# Patient Record
Sex: Female | Born: 1980 | Race: White | Hispanic: No | Marital: Married | Smoking: Former smoker
Health system: Southern US, Community
[De-identification: ages and names within clinical notes are randomized; demographics above are authoritative.]

## PROBLEM LIST (undated history)

## (undated) DIAGNOSIS — O24419 Gestational diabetes mellitus in pregnancy, unspecified control: Secondary | ICD-10-CM

## (undated) DIAGNOSIS — E538 Deficiency of other specified B group vitamins: Secondary | ICD-10-CM

## (undated) DIAGNOSIS — F411 Generalized anxiety disorder: Secondary | ICD-10-CM

## (undated) DIAGNOSIS — F410 Panic disorder [episodic paroxysmal anxiety] without agoraphobia: Secondary | ICD-10-CM

## (undated) DIAGNOSIS — F988 Other specified behavioral and emotional disorders with onset usually occurring in childhood and adolescence: Secondary | ICD-10-CM

## (undated) DIAGNOSIS — E119 Type 2 diabetes mellitus without complications: Secondary | ICD-10-CM

## (undated) DIAGNOSIS — F329 Major depressive disorder, single episode, unspecified: Secondary | ICD-10-CM

## (undated) HISTORY — DX: Type 2 diabetes mellitus without complications: E11.9

## (undated) HISTORY — PX: MOUTH SURGERY: SHX715

## (undated) HISTORY — DX: Deficiency of other specified B group vitamins: E53.8

---

## 2012-04-01 ENCOUNTER — Inpatient Hospital Stay (HOSPITAL_COMMUNITY)
Admission: AD | Admit: 2012-04-01 | Discharge: 2012-04-01 | Disposition: A | Discharge status: Home / Self Care | Payer: BC Managed Care – PPO | Source: Ambulatory Visit | Attending: Family Medicine | Admitting: Family Medicine

## 2012-04-01 ENCOUNTER — Inpatient Hospital Stay (HOSPITAL_COMMUNITY): Payer: BC Managed Care – PPO

## 2012-04-01 ENCOUNTER — Encounter (HOSPITAL_COMMUNITY): Payer: Self-pay

## 2012-04-01 DIAGNOSIS — O219 Vomiting of pregnancy, unspecified: Secondary | ICD-10-CM

## 2012-04-01 DIAGNOSIS — O26859 Spotting complicating pregnancy, unspecified trimester: Secondary | ICD-10-CM | POA: Insufficient documentation

## 2012-04-01 DIAGNOSIS — O210 Mild hyperemesis gravidarum: Secondary | ICD-10-CM | POA: Insufficient documentation

## 2012-04-01 DIAGNOSIS — O209 Hemorrhage in early pregnancy, unspecified: Secondary | ICD-10-CM

## 2012-04-01 LAB — CBC
HCT: 35.4 % — ABNORMAL LOW (ref 36.0–46.0)
MCH: 31 pg (ref 26.0–34.0)
MCV: 92.9 fL (ref 78.0–100.0)
Platelets: 327 10*3/uL (ref 150–400)
RDW: 12.6 % (ref 11.5–15.5)

## 2012-04-01 LAB — TYPE AND SCREEN: Antibody Screen: NEGATIVE

## 2012-04-01 LAB — WET PREP, GENITAL
Trich, Wet Prep: NONE SEEN
Yeast Wet Prep HPF POC: NONE SEEN

## 2012-04-01 LAB — URINALYSIS, ROUTINE W REFLEX MICROSCOPIC
Glucose, UA: NEGATIVE mg/dL
Hgb urine dipstick: NEGATIVE
Specific Gravity, Urine: 1.01 (ref 1.005–1.030)
pH: 7 (ref 5.0–8.0)

## 2012-04-01 LAB — POCT PREGNANCY, URINE: Preg Test, Ur: POSITIVE — AB

## 2012-04-01 LAB — URINE MICROSCOPIC-ADD ON

## 2012-04-01 NOTE — MAU Provider Note (Signed)
  History     CSN: [id number]  Arrival date and time: 04/01/12 1705   None     Chief Complaint  Patient presents with  . Vaginal Bleeding   HPI  31 year old female here with her husband for concern for spotting.  Has had 4 episodes of spotting, the first three were discharge mixed with blood, and then a fourth episode which was drops of blood.  No abdominal pain.    OB History    Grav Para Term Preterm Abortions TAB SAB Ect Mult Living   2    1 1           History reviewed. No pertinent past medical history.  Past Surgical History  Procedure Date  . Mouth surgery     History reviewed. No pertinent family history.  History  Substance Use Topics  . Smoking status: Current Everyday Smoker    Types: Cigarettes  . Smokeless tobacco: Not on file   Comment: quit ist of june 2013  . Alcohol Use: No    Allergies:  Allergies  Allergen Reactions  . Augmentin (Amoxicillin-Pot Clavulanate) Nausea And Vomiting    Prescriptions prior to admission  Medication Sig Dispense Refill  . PRENATAL VITAMINS PO Take 1 tablet by mouth daily.        ROS  See HPI.  Other pertinent ROS negative.  Physical Exam   Blood pressure 109/63, pulse 71, temperature 98.6 F (37 C), temperature source Oral, resp. rate 18, height 5' 5.5" (1.664 m), weight 58.514 kg (129 lb), last menstrual period 02/18/2012.  Physical Exam  Constitutional: She appears well-developed and well-nourished.  HENT:  Head: Normocephalic and atraumatic.  Eyes: Conjunctivae and EOM are normal.  Neck: No tracheal deviation present.  Cardiovascular: Normal rate, regular rhythm and normal heart sounds.   Respiratory: Effort normal and breath sounds normal. No stridor.  GI: Soft. She exhibits no distension and no mass. There is no tenderness. There is no rebound and no guarding.  Genitourinary:       No lesions on labia externally.  Minimal leukorrhea, no blood.  Cervix normal.  Sample for wet prep and GC/chlam  taken.  Cervix palpated as firm, closed, thick, posterior.    MAU Course  Procedures  Ultrasound: Technique: Transabdominal ultrasound was performed for evaluation  of the gestation as well as the maternal uterus and adnexal  regions.  Comparison: None.  Intrauterine gestational sac: Present.  Yolk sac: Present  Embryo: Present.  Cardiac Activity: Present; 117 beats per minute  CRL: 6 mm 6w 4d [place] EDC: 11/21/2012  Maternal uterus/Adnexae:  Normal appearance of the ovaries. No significant free fluid. No  subchorionic hemorrhage.  IMPRESSION:  Intrauterine pregnancy of 6 weeks 6 days with fetal heart rate of  117 beats per minute.   Assessment and Plan  Implantation bleeding Nausea of pregnancy  Continue routine prenatal care  Clancy Gourd 04/01/2012, 6:12 PM   Patient seen and examined.  Agree with above note.  Levie Heritage, DO 04/01/2012 8:06 PM

## 2012-04-01 NOTE — Discharge Instructions (Signed)
Come back to the MAU if you experience continued bleeding or cramping. --- Morning Sickness Morning sickness is when you feel sick to your stomach (nauseous) during pregnancy. This nauseous feeling may or may not come with throwing up (vomiting). It often occurs in the morning, but can be a problem any time of day. While morning sickness is unpleasant, it is usually harmless unless you develop severe and continual vomiting (hyperemesis gravidarum). This condition requires more intense treatment. CAUSES  The cause of morning sickness is not completely known but seems to be related to a sudden increase of two hormones:   Human chorionic gonadotropin (hCG).   Estrogen hormone.  These are elevated in the first part of the pregnancy. TREATMENT  Do not use any medicines (prescription, over-the-counter, or herbal) for morning sickness without first talking to your caregiver. Some patients are helped by the following:  Vitamin B6 (25mg  every 8 hours) or vitamin B6 shots.   An antihistamine called doxylamine (10mg  every 8 hours).   The herbal medication ginger.  HOME CARE INSTRUCTIONS   Taking multivitamins before getting pregnant can prevent or decrease the severity of morning sickness in most women.   Eat a piece of dry toast or unsalted crackers before getting out of bed in the morning.   Eat 5 or 6 small meals a day.   Eat dry and bland foods (rice, baked potato).   Do not drink liquids with your meals. Drink liquids between meals.   Avoid greasy, fatty, and spicy foods.   Get someone to cook for you if the smell of any food causes nausea and vomiting.   Avoid vitamin pills with iron because iron can cause nausea.   Snack on protein foods between meals if you are hungry.   Eat unsweetened gelatins for deserts.   Wear an acupressure wristband (worn for sea sickness) may be helpful.   Acupuncture may be helpful.   Do not smoke.   Get a humidifier to keep the air in your house  free of odors.  SEEK MEDICAL CARE IF:   Your home remedies are not working and you need medication.   You feel dizzy or lightheaded.   You are losing weight.   You need help with your diet.  SEEK IMMEDIATE MEDICAL CARE IF:   You have persistent and uncontrolled nausea and vomiting.   You pass out (faint).   You have a fever.  MAKE SURE YOU:   Understand these instructions.   Will watch your condition.   Will get help right away if you are not doing well or get worse.  Document Released: 11/29/2006 Document Revised: 09/27/2011 Document Reviewed: 09/26/2007 [facility] Patient Information 2012 [place], [place]

## 2012-04-01 NOTE — MAU Note (Signed)
Patient is here with c/o intermittent spotting. Today its red spot twice and no pain. She states that she had a mild cramp once on Friday. She has her 1st ob appt [facility] on 04/14/12.

## 2012-04-01 NOTE — MAU Note (Signed)
Pregnant.  No pain. Last Wed had one pink spot, has noticed spotting almost every day since then .  Today was more red then pink.

## 2012-04-02 LAB — GC/CHLAMYDIA PROBE AMP, GENITAL: GC Probe Amp, Genital: NEGATIVE

## 2012-04-17 LAB — OB RESULTS CONSOLE RUBELLA ANTIBODY, IGM: Rubella: IMMUNE

## 2012-04-17 LAB — OB RESULTS CONSOLE HEPATITIS B SURFACE ANTIGEN: Hepatitis B Surface Ag: NEGATIVE

## 2012-04-17 LAB — OB RESULTS CONSOLE RPR: RPR: NONREACTIVE

## 2012-09-17 ENCOUNTER — Encounter: Payer: Self-pay | Admitting: *Deleted

## 2012-09-17 ENCOUNTER — Encounter: Payer: BC Managed Care – PPO | Attending: Obstetrics and Gynecology | Admitting: *Deleted

## 2012-09-17 DIAGNOSIS — Z713 Dietary counseling and surveillance: Secondary | ICD-10-CM | POA: Insufficient documentation

## 2012-09-17 DIAGNOSIS — O99810 Abnormal glucose complicating pregnancy: Secondary | ICD-10-CM | POA: Insufficient documentation

## 2012-09-17 NOTE — Patient Instructions (Signed)
Goals:  Check glucose levels per MD as instructed  Follow Gestational Diabetes Diet as instructed  Call for follow-up as needed    

## 2012-09-17 NOTE — Progress Notes (Signed)
  Patient was seen on 09/17/2012 for Gestational Diabetes self-management class at the [facility]. The following learning objectives were met by the patient during this course:   States the definition of Gestational Diabetes  States why dietary management is important in controlling blood glucose  Describes the effects each nutrient has on blood glucose levels  Demonstrates ability to create a balanced meal plan  Demonstrates carbohydrate counting   States when to check blood glucose levels  Demonstrates proper blood glucose monitoring techniques  States the effect of stress and exercise on blood glucose levels  States the importance of limiting caffeine and abstaining from alcohol and smoking  Blood glucose monitor given: Blood glucose monitor given: Contour Next EZ Blood Glucose Kit Lot # [id number] Exp: 08/2013 Blood glucose reading: 89 mg/dl  Patient instructed to monitor glucose levels: FBS: 60 - <90 2 hour: <120  *Patient received handouts:  Nutrition Diabetes and Pregnancy  Carbohydrate Counting List  Patient will be seen for follow-up as needed.

## 2012-10-23 LAB — OB RESULTS CONSOLE GBS: GBS: NEGATIVE

## 2012-11-14 ENCOUNTER — Encounter (HOSPITAL_COMMUNITY): Payer: Self-pay | Admitting: *Deleted

## 2012-11-14 ENCOUNTER — Inpatient Hospital Stay (HOSPITAL_COMMUNITY)
Admission: AD | Admit: 2012-11-14 | Discharge: 2012-11-14 | Disposition: A | Discharge status: Home / Self Care | Payer: BC Managed Care – PPO | Source: Ambulatory Visit | Attending: Obstetrics and Gynecology | Admitting: Obstetrics and Gynecology

## 2012-11-14 DIAGNOSIS — R109 Unspecified abdominal pain: Secondary | ICD-10-CM | POA: Insufficient documentation

## 2012-11-14 DIAGNOSIS — O479 False labor, unspecified: Secondary | ICD-10-CM | POA: Insufficient documentation

## 2012-11-14 DIAGNOSIS — O99810 Abnormal glucose complicating pregnancy: Secondary | ICD-10-CM | POA: Insufficient documentation

## 2012-11-14 HISTORY — DX: Gestational diabetes mellitus in pregnancy, unspecified control: O24.419

## 2012-11-14 NOTE — MAU Provider Note (Signed)
Chief Complaint:  Abdominal Pain   First Provider Initiated Contact with Patient 11/14/12 1514      HPI: Victoria Weeks is a 32 y.o. G2P0010 at [redacted]w[redacted]d who presents to maternity admissions reporting bilateral pain in sides/groin. She was at work and started feeling pain, like muscle cramp. Tried to walk it off. Had to stop walking a couple of times due to pain. No fever/chills. No nausea, vomiting, diarrhea or constipation. No dysuria or urgency. No vaginal bleeding or loss of fluid. Good fetal movement.   Sees Dr. Vickey Sages, last visit 11/06/12. Next visit on 11/21/12. Complications this pregnancy include diet controlled GDM.   Pregnancy Course:   Past Medical History: Past Medical History  Diagnosis Date  . Diabetes mellitus without complication   . Gestational diabetes     Past obstetric history: OB History    Grav Para Term Preterm Abortions TAB SAB Ect Mult Living   2    1 1          # Outc Date GA Lbr Len/2nd Wgt Sex Del Anes PTL Lv   1 TAB            2 CUR               Past Surgical History: Past Surgical History  Procedure Date  . Mouth surgery     Family History: History reviewed. No pertinent family history.  Social History: History  Substance Use Topics  . Smoking status: Former Smoker    Types: Cigarettes  . Smokeless tobacco: Not on file     Comment: quit ist of june 2013  . Alcohol Use: No    Allergies:  Allergies  Allergen Reactions  . Augmentin (Amoxicillin-Pot Clavulanate) Nausea And Vomiting    Meds:  Prescriptions prior to admission  Medication Sig Dispense Refill  . calcium carbonate (TUMS - DOSED IN MG ELEMENTAL CALCIUM) 500 MG chewable tablet Chew 1-2 tablets by mouth daily as needed. For heartburn      . Prenatal Vit-Fe Fumarate-FA (PRENATAL MULTIVITAMIN) TABS Take 1 tablet by mouth daily with lunch.        ROS: Pertinent findings in history of present illness.  Physical Exam  Blood pressure 104/75, pulse 89, temperature 97.5 F (36.4  C), temperature source Oral, resp. rate 20, last menstrual period 02/18/2012. GENERAL: Well-developed, well-nourished female in no acute distress.  HEENT: normocephalic HEART: normal rate RESP: normal effort ABDOMEN: Soft, non-tender, gravid appropriate for gestational age EXTREMITIES: Nontender, no edema NEURO: alert and oriented SPECULUM EXAM: NEFG, physiologic discharge, no blood, cervix clean Dilation: Closed Effacement (%): 30 Cervical Position: Posterior Station: -3 Exam by:: Dorrene German RN  FHT:  Baseline 120 , moderate variability, accelerations present, no decelerations Contractions: irreg, mild, uterine irritability   Labs: No results found for this or any previous visit (from the past 24 hour(s)).  Imaging:  No results found. MAU Course:   Assessment: Braxton-Hicks contractions or round ligament pain  Plan: Discharge home Labor precautions and fetal kick counts Discussed with Dr. Vincente Poli.    Medication List     As of 11/14/2012  3:14 PM    ASK your doctor about these medications         calcium carbonate 500 MG chewable tablet   Commonly known as: TUMS - dosed in mg elemental calcium   Chew 1-2 tablets by mouth daily as needed. For heartburn      prenatal multivitamin Tabs   Take 1 tablet by mouth daily with lunch.  Napoleon Form, MD 11/14/12  16:08 PM

## 2012-11-14 NOTE — MAU Note (Signed)
Sharp lower abd pain, worse on R since 1240, unsure if uc's, denies bleeding or LOF.  Active FM.

## 2012-11-14 NOTE — Progress Notes (Signed)
Dr. Vincente Poli informed of pt status, C/O sharp Lower abd pain, unsure if uc's, irregular uc's, reactive FHR, SVE... Requests that MAU mid-level see pt.

## 2012-11-21 ENCOUNTER — Inpatient Hospital Stay (HOSPITAL_COMMUNITY)
Admission: AD | Admit: 2012-11-21 | Discharge: 2012-11-24 | DRG: 372 | Disposition: A | Discharge status: Home / Self Care | Payer: BC Managed Care – PPO | Source: Ambulatory Visit | Attending: Obstetrics & Gynecology | Admitting: Obstetrics & Gynecology

## 2012-11-21 ENCOUNTER — Encounter (HOSPITAL_COMMUNITY): Payer: Self-pay | Admitting: *Deleted

## 2012-11-21 ENCOUNTER — Encounter (HOSPITAL_COMMUNITY): Payer: Self-pay | Admitting: Anesthesiology

## 2012-11-21 ENCOUNTER — Inpatient Hospital Stay (HOSPITAL_COMMUNITY): Payer: BC Managed Care – PPO | Admitting: Anesthesiology

## 2012-11-21 DIAGNOSIS — O4290 Premature rupture of membranes, unspecified as to length of time between rupture and onset of labor, unspecified weeks of gestation: Principal | ICD-10-CM | POA: Diagnosis present

## 2012-11-21 DIAGNOSIS — O99814 Abnormal glucose complicating childbirth: Secondary | ICD-10-CM | POA: Diagnosis present

## 2012-11-21 HISTORY — DX: Generalized anxiety disorder: F41.1

## 2012-11-21 HISTORY — DX: Panic disorder (episodic paroxysmal anxiety): F41.0

## 2012-11-21 HISTORY — DX: Major depressive disorder, single episode, unspecified: F32.9

## 2012-11-21 HISTORY — DX: Other specified behavioral and emotional disorders with onset usually occurring in childhood and adolescence: F98.8

## 2012-11-21 LAB — GLUCOSE, CAPILLARY
Glucose-Capillary: 102 mg/dL — ABNORMAL HIGH (ref 70–99)
Glucose-Capillary: 109 mg/dL — ABNORMAL HIGH (ref 70–99)

## 2012-11-21 LAB — POCT FERN TEST: POCT Fern Test: POSITIVE

## 2012-11-21 LAB — CBC
Hemoglobin: 11.8 g/dL — ABNORMAL LOW (ref 12.0–15.0)
RBC: 3.93 MIL/uL (ref 3.87–5.11)

## 2012-11-21 LAB — RPR: RPR Ser Ql: NONREACTIVE

## 2012-11-21 MED ORDER — OXYTOCIN 40 UNITS IN LACTATED RINGERS INFUSION - SIMPLE MED
1.0000 m[IU]/min | INTRAVENOUS | Status: DC
  Administered 2012-11-21: 2 m[IU]/min via INTRAVENOUS
  Filled 2012-11-21: qty 1000

## 2012-11-21 MED ORDER — BUTORPHANOL TARTRATE 1 MG/ML IJ SOLN
1.0000 mg | INTRAMUSCULAR | Status: DC | PRN
  Administered 2012-11-21 (×2): 1 mg via INTRAVENOUS
  Filled 2012-11-21 (×2): qty 1

## 2012-11-21 MED ORDER — PHENYLEPHRINE 40 MCG/ML (10ML) SYRINGE FOR IV PUSH (FOR BLOOD PRESSURE SUPPORT)
80.0000 ug | PREFILLED_SYRINGE | INTRAVENOUS | Status: DC | PRN
  Filled 2012-11-21: qty 5

## 2012-11-21 MED ORDER — TERBUTALINE SULFATE 1 MG/ML IJ SOLN: 0.2500 mg | Freq: Once | INTRAMUSCULAR | Status: AC | PRN

## 2012-11-21 MED ORDER — IBUPROFEN 600 MG PO TABS
600.0000 mg | ORAL_TABLET | Freq: Four times a day (QID) | ORAL | Status: DC | PRN
  Administered 2012-11-22: 600 mg via ORAL
  Filled 2012-11-21: qty 1

## 2012-11-21 MED ORDER — PHENYLEPHRINE 40 MCG/ML (10ML) SYRINGE FOR IV PUSH (FOR BLOOD PRESSURE SUPPORT): 80.0000 ug | PREFILLED_SYRINGE | INTRAVENOUS | Status: DC | PRN

## 2012-11-21 MED ORDER — LACTATED RINGERS IV SOLN
INTRAVENOUS | Status: DC
  Administered 2012-11-21: 15:00:00 via INTRAVENOUS
  Administered 2012-11-21: 125 mL/h via INTRAVENOUS

## 2012-11-21 MED ORDER — PROMETHAZINE HCL 25 MG/ML IJ SOLN: 12.5000 mg | Freq: Four times a day (QID) | INTRAMUSCULAR | Status: DC | PRN

## 2012-11-21 MED ORDER — ONDANSETRON HCL 4 MG/2ML IJ SOLN: 4.0000 mg | Freq: Four times a day (QID) | INTRAMUSCULAR | Status: DC | PRN

## 2012-11-21 MED ORDER — ACETAMINOPHEN 325 MG PO TABS: 650.0000 mg | ORAL_TABLET | ORAL | Status: DC | PRN

## 2012-11-21 MED ORDER — OXYCODONE-ACETAMINOPHEN 5-325 MG PO TABS
1.0000 | ORAL_TABLET | ORAL | Status: DC | PRN
  Administered 2012-11-22: 1 via ORAL
  Filled 2012-11-21: qty 1

## 2012-11-21 MED ORDER — LIDOCAINE HCL (PF) 1 % IJ SOLN
INTRAMUSCULAR | Status: DC | PRN
  Administered 2012-11-21 (×2): 9 mL

## 2012-11-21 MED ORDER — OXYTOCIN 40 UNITS IN LACTATED RINGERS INFUSION - SIMPLE MED: 62.5000 mL/h | INTRAVENOUS | Status: DC

## 2012-11-21 MED ORDER — CITRIC ACID-SODIUM CITRATE 334-500 MG/5ML PO SOLN
ORAL | Status: AC
  Filled 2012-11-21: qty 15

## 2012-11-21 MED ORDER — EPHEDRINE 5 MG/ML INJ: 10.0000 mg | INTRAVENOUS | Status: DC | PRN

## 2012-11-21 MED ORDER — FENTANYL 2.5 MCG/ML BUPIVACAINE 1/10 % EPIDURAL INFUSION (WH - ANES)
14.0000 mL/h | INTRAMUSCULAR | Status: DC
  Administered 2012-11-21: 14 mL/h via EPIDURAL
  Filled 2012-11-21 (×2): qty 125

## 2012-11-21 MED ORDER — LACTATED RINGERS IV SOLN
500.0000 mL | Freq: Once | INTRAVENOUS | Status: AC
  Administered 2012-11-21: 500 mL via INTRAVENOUS

## 2012-11-21 MED ORDER — LIDOCAINE HCL (PF) 1 % IJ SOLN
30.0000 mL | INTRAMUSCULAR | Status: DC | PRN
  Filled 2012-11-21: qty 30

## 2012-11-21 MED ORDER — CITRIC ACID-SODIUM CITRATE 334-500 MG/5ML PO SOLN
30.0000 mL | ORAL | Status: DC | PRN
  Administered 2012-11-21 (×2): 30 mL via ORAL
  Filled 2012-11-21: qty 15

## 2012-11-21 MED ORDER — DIPHENHYDRAMINE HCL 50 MG/ML IJ SOLN: 12.5000 mg | INTRAMUSCULAR | Status: DC | PRN

## 2012-11-21 MED ORDER — FENTANYL 2.5 MCG/ML BUPIVACAINE 1/10 % EPIDURAL INFUSION (WH - ANES)
INTRAMUSCULAR | Status: DC | PRN
  Administered 2012-11-21: 14 mL/h via EPIDURAL

## 2012-11-21 MED ORDER — FLEET ENEMA 7-19 GM/118ML RE ENEM: 1.0000 | ENEMA | RECTAL | Status: DC | PRN

## 2012-11-21 MED ORDER — LACTATED RINGERS IV SOLN
500.0000 mL | INTRAVENOUS | Status: DC | PRN
  Administered 2012-11-21: 500 mL via INTRAVENOUS

## 2012-11-21 MED ORDER — EPHEDRINE 5 MG/ML INJ
10.0000 mg | INTRAVENOUS | Status: DC | PRN
  Filled 2012-11-21: qty 4

## 2012-11-21 MED ORDER — OXYTOCIN BOLUS FROM INFUSION
500.0000 mL | INTRAVENOUS | Status: DC
  Administered 2012-11-22: 500 mL via INTRAVENOUS

## 2012-11-21 NOTE — Anesthesia Procedure Notes (Signed)
Epidural Patient location during procedure: OB Start time: 11/21/2012 4:15 PM End time: 11/21/2012 4:19 PM  Staffing Anesthesiologist: Sandrea Hughs Performed by: anesthesiologist   Preanesthetic Checklist Completed: patient identified, site marked, surgical consent, pre-op evaluation, timeout performed, IV checked, risks and benefits discussed and monitors and equipment checked  Epidural Patient position: sitting Prep: site prepped and draped and DuraPrep Patient monitoring: continuous pulse ox and blood pressure Approach: midline Injection technique: LOR air  Needle:  Needle type: Tuohy  Needle gauge: 17 G Needle length: 9 cm and 9 Needle insertion depth: 6 cm Catheter type: closed end flexible Catheter size: 19 Gauge Catheter at skin depth: 12 cm Test dose: negative and Other  Assessment Sensory level: T8 Events: blood not aspirated, injection not painful, no injection resistance, negative IV test and no paresthesia  Additional Notes Reason for block:procedure for pain

## 2012-11-21 NOTE — Progress Notes (Signed)
MD notified of pt status, FHR, decelerations, SVE, UC pattern, and RN interventions. MD states to labor down if decelerations resolve. Will continue to monitor.

## 2012-11-21 NOTE — Progress Notes (Signed)
MD updated on pt status, SVE and UC pattern. Will continue to monitor.

## 2012-11-21 NOTE — Anesthesia Preprocedure Evaluation (Signed)
Anesthesia Evaluation  Patient identified by MRN, date of birth, ID band Patient awake    Reviewed: Allergy & Precautions, H&P , NPO status , Patient's Chart, lab work & pertinent test results  Airway Mallampati: I TM Distance: >3 FB Neck ROM: full    Dental No notable dental hx.    Pulmonary neg pulmonary ROS,    Pulmonary exam normal       Cardiovascular negative cardio ROS      Neuro/Psych negative neurological ROS  negative psych ROS   GI/Hepatic negative GI ROS, Neg liver ROS,   Endo/Other  negative endocrine ROSdiabetes, Gestational  Renal/GU negative Renal ROS  negative genitourinary   Musculoskeletal negative musculoskeletal ROS (+)   Abdominal Normal abdominal exam  (+)   Peds  Hematology negative hematology ROS (+)   Anesthesia Other Findings   Reproductive/Obstetrics (+) Pregnancy                           Anesthesia Physical Anesthesia Plan  ASA: II  Anesthesia Plan: Epidural   Post-op Pain Management:    Induction:   Airway Management Planned:   Additional Equipment:   Intra-op Plan:   Post-operative Plan:   Informed Consent: I have reviewed the patients History and Physical, chart, labs and discussed the procedure including the risks, benefits and alternatives for the proposed anesthesia with the patient or authorized representative who has indicated his/her understanding and acceptance.     Plan Discussed with:   Anesthesia Plan Comments:         Anesthesia Quick Evaluation

## 2012-11-21 NOTE — H&P (Signed)
Victoria Weeks is a 32 y.o. female presenting for PROM at 630.  CTX q 3-4 minutes with increasing intensity.  +FM.  A1DM with good control.   Maternal Medical History:  Reason for admission: Reason for admission: rupture of membranes.  Contractions: Onset was 3-5 hours ago.   Frequency: regular.   Perceived severity is moderate.    Fetal activity: Perceived fetal activity is normal.    Prenatal complications: no prenatal complications Prenatal Complications - Diabetes: gestational. Diabetes is managed by diet.      OB History    Grav Para Term Preterm Abortions TAB SAB Ect Mult Living   2    1 1     0     Past Medical History  Diagnosis Date  . Diabetes mellitus without complication   . Gestational diabetes    Past Surgical History  Procedure Date  . Mouth surgery    Family History: family history is not on file. Social History:  reports that she has quit smoking. Her smoking use included Cigarettes. She has never used smokeless tobacco. She reports that she does not drink alcohol or use illicit drugs.   Prenatal Transfer Tool  Maternal Diabetes: Yes:  Diabetes Type:  Diet controlled Genetic Screening: Normal Maternal Ultrasounds/Referrals: Normal Fetal Ultrasounds or other Referrals:  None Maternal Substance Abuse:  No Significant Maternal Medications:  None Significant Maternal Lab Results:  None Other Comments:  None  ROS  Dilation: 1.5 Effacement (%): 80 Station: -3 Exam by:: S. Carrera, RNC Blood pressure 115/84, pulse 86, temperature 98.2 F (36.8 C), temperature source Oral, resp. rate 18, height 5\' 6"  (1.676 m), weight 160 lb (72.576 kg), last menstrual period 02/18/2012. Maternal Exam:  Uterine Assessment: Contraction strength is moderate.  Contraction frequency is regular.   Abdomen: Patient reports no abdominal tenderness. Fundal height is c/w dates.   Estimated fetal weight is 7#12.   Fetal presentation: vertex     Physical Exam   Constitutional: She is oriented to person, place, and time. She appears well-developed and well-nourished.  GI: Soft.  Neurological: She is alert and oriented to person, place, and time.  Skin: Skin is warm and dry.  Psychiatric: She has a normal mood and affect. Her behavior is normal.    Prenatal labs: ABO, Rh: --/--/A POS, A POS (06/11 1802) Antibody: NEG (06/11 1802) Rubella:   RPR:    HBsAg:    HIV:    GBS: Negative (01/02 0000)   Assessment/Plan: 31yo G2P0 at [redacted]w[redacted]d with labor -GBS negative -Epidural when ready -Recheck in 3-4 hours and if no change, add pitocin -A1DM-will check fsbs q 4 hours    Victoria Weeks 11/21/2012, 8:56 AM

## 2012-11-22 ENCOUNTER — Encounter (HOSPITAL_COMMUNITY): Payer: Self-pay | Admitting: Family Medicine

## 2012-11-22 LAB — CBC
HCT: 29 % — ABNORMAL LOW (ref 36.0–46.0)
Hemoglobin: 9.5 g/dL — ABNORMAL LOW (ref 12.0–15.0)
MCH: 29.8 pg (ref 26.0–34.0)
MCV: 90.9 fL (ref 78.0–100.0)
RBC: 3.19 MIL/uL — ABNORMAL LOW (ref 3.87–5.11)

## 2012-11-22 MED ORDER — ONDANSETRON HCL 4 MG/2ML IJ SOLN: 4.0000 mg | INTRAMUSCULAR | Status: DC | PRN

## 2012-11-22 MED ORDER — DIPHENHYDRAMINE HCL 25 MG PO CAPS: 25.0000 mg | ORAL_CAPSULE | Freq: Four times a day (QID) | ORAL | Status: DC | PRN

## 2012-11-22 MED ORDER — PRENATAL MULTIVITAMIN CH
1.0000 | ORAL_TABLET | Freq: Every day | ORAL | Status: DC
  Administered 2012-11-22 – 2012-11-24 (×3): 1 via ORAL
  Filled 2012-11-22 (×3): qty 1

## 2012-11-22 MED ORDER — BENZOCAINE-MENTHOL 20-0.5 % EX AERO
1.0000 "application " | INHALATION_SPRAY | CUTANEOUS | Status: DC | PRN
  Administered 2012-11-22: 1 via TOPICAL
  Filled 2012-11-22: qty 56

## 2012-11-22 MED ORDER — LANOLIN HYDROUS EX OINT: TOPICAL_OINTMENT | CUTANEOUS | Status: DC | PRN

## 2012-11-22 MED ORDER — DIBUCAINE 1 % RE OINT: 1.0000 "application " | TOPICAL_OINTMENT | RECTAL | Status: DC | PRN

## 2012-11-22 MED ORDER — SENNOSIDES-DOCUSATE SODIUM 8.6-50 MG PO TABS
2.0000 | ORAL_TABLET | Freq: Every day | ORAL | Status: DC
  Administered 2012-11-22 – 2012-11-23 (×2): 2 via ORAL

## 2012-11-22 MED ORDER — WITCH HAZEL-GLYCERIN EX PADS: 1.0000 "application " | MEDICATED_PAD | CUTANEOUS | Status: DC | PRN

## 2012-11-22 MED ORDER — OXYCODONE-ACETAMINOPHEN 5-325 MG PO TABS
1.0000 | ORAL_TABLET | ORAL | Status: DC | PRN
  Administered 2012-11-22 – 2012-11-24 (×9): 1 via ORAL
  Filled 2012-11-22 (×9): qty 1

## 2012-11-22 MED ORDER — IBUPROFEN 600 MG PO TABS
600.0000 mg | ORAL_TABLET | Freq: Four times a day (QID) | ORAL | Status: DC
  Administered 2012-11-22 – 2012-11-24 (×9): 600 mg via ORAL
  Filled 2012-11-22 (×9): qty 1

## 2012-11-22 MED ORDER — FERROUS SULFATE 325 (65 FE) MG PO TABS
325.0000 mg | ORAL_TABLET | Freq: Two times a day (BID) | ORAL | Status: DC
  Administered 2012-11-22 – 2012-11-24 (×4): 325 mg via ORAL
  Filled 2012-11-22 (×4): qty 1

## 2012-11-22 MED ORDER — TETANUS-DIPHTH-ACELL PERTUSSIS 5-2.5-18.5 LF-MCG/0.5 IM SUSP: 0.5000 mL | Freq: Once | INTRAMUSCULAR | Status: DC

## 2012-11-22 MED ORDER — ZOLPIDEM TARTRATE 5 MG PO TABS: 5.0000 mg | ORAL_TABLET | Freq: Every evening | ORAL | Status: DC | PRN

## 2012-11-22 MED ORDER — SIMETHICONE 80 MG PO CHEW: 80.0000 mg | CHEWABLE_TABLET | ORAL | Status: DC | PRN

## 2012-11-22 MED ORDER — ONDANSETRON HCL 4 MG PO TABS: 4.0000 mg | ORAL_TABLET | ORAL | Status: DC | PRN

## 2012-11-22 NOTE — Progress Notes (Signed)
Post Partum Day 0 Subjective: no complaints, voiding and tolerating PO  Objective: Blood pressure 87/53, pulse 97, temperature 97.8 F (36.6 C), temperature source Oral, resp. rate 18, height 5\' 6"  (1.676 m), weight 160 lb (72.576 kg), last menstrual period 02/18/2012, unknown if currently breastfeeding.  Physical Exam:  General: alert, cooperative and appears stated age Lochia: appropriate Uterine Fundus: firm Incision: healing well, no significant drainage, no dehiscence, no significant erythema DVT Evaluation: No evidence of DVT seen on physical exam. Negative Homan's sign. No cords or calf tenderness.   Basename 11/22/12 0649 11/21/12 0825  HGB 9.5* 11.8*  HCT 29.0* 35.4*    Assessment/Plan: Plan for discharge tomorrow and Breastfeeding   LOS: 1 day   Uzoma Vivona 11/22/2012, 9:46 AM

## 2012-11-22 NOTE — Anesthesia Postprocedure Evaluation (Signed)
Anesthesia Post Note  Patient: Victoria Weeks  Procedure(s) Performed: * No procedures listed *  Anesthesia type: Epidural  Patient location: Mother/Baby  Post pain: Pain level controlled  Post assessment: Post-op Vital signs reviewed  Last Vitals:  Filed Vitals:   11/22/12 0843  BP: 87/53  Pulse: 97  Temp: 36.6 C  Resp: 18    Post vital signs: Reviewed  Level of consciousness:alert  Complications: No apparent anesthesia complications

## 2012-11-22 NOTE — Op Note (Signed)
SVD of VFI wt pending, cord pH pending, APGARs 9,9; EBL 400cc Head delivered ROA with mouth and nose bulb suctioned. Body delivered atraumatically.  Cord was clamped, cut and baby placed on the abdomen.  Cord pH was obtained.  Placenta delivered S/I/3VC.  Fundus firmed with pitocin and massage.  Second degree and right periurethral lacerations repaired with 3-0 Rapide in the normal fashion.  Mom and baby stable.    Mitchel Honour, DO

## 2012-11-22 NOTE — Clinical Social Work Note (Signed)
CSW spoke with MOB briefly.  MOB reports hx from early 20's, no current concerns.  CSW instructed MOB to let RN or CSW know if any concerns arise.    Patient was referred for history of depression/anxiety.  * Referral screened out by Clinical Social Worker because none of the following criteria appear to apply: ~ History of anxiety/depression during this pregnancy, or of post-partum depression. ~ Diagnosis of anxiety and/or depression within last 3 years ~ History of depression due to pregnancy loss/loss of child  OR  * Patient's symptoms currently being treated with medication and/or therapy.  Please contact the Clinical Social Worker if needs arise, or by the patient's request. 

## 2012-11-23 NOTE — Progress Notes (Signed)
Post Partum Day 1 Subjective: no complaints, up ad lib, voiding and tolerating PO  Objective: Blood pressure 87/48, pulse 77, temperature 97.6 F (36.4 C), temperature source Oral, resp. rate 18, height 5\' 6"  (1.676 m), weight 160 lb (72.576 kg), last menstrual period 02/18/2012, unknown if currently breastfeeding.  Physical Exam:  General: alert, cooperative and appears stated age Lochia: appropriate Uterine Fundus: firm Incision: healing well, no significant drainage, no dehiscence DVT Evaluation: No evidence of DVT seen on physical exam. Negative Homan's sign. No cords or calf tenderness.   Basename 11/22/12 0649 11/21/12 0825  HGB 9.5* 11.8*  HCT 29.0* 35.4*    Assessment/Plan: Plan for discharge tomorrow and Breastfeeding   LOS: 2 days   Victoria Weeks 11/23/2012, 9:50 AM

## 2012-11-24 MED ORDER — OXYCODONE-ACETAMINOPHEN 5-325 MG PO TABS: 1.0000 | ORAL_TABLET | ORAL | Status: DC | PRN

## 2012-11-24 MED ORDER — IBUPROFEN 600 MG PO TABS: 600.0000 mg | ORAL_TABLET | Freq: Four times a day (QID) | ORAL | Status: DC

## 2012-11-24 NOTE — Discharge Summary (Signed)
Obstetric Discharge Summary Reason for Admission: induction of labor Prenatal Procedures: ultrasound Intrapartum Procedures: spontaneous vaginal delivery Postpartum Procedures: none Complications-Operative and Postpartum: 2 degree perineal laceration Hemoglobin  Date Value Range Status  11/22/2012 9.5* 12.0 - 15.0 g/dL Final     DELTA CHECK NOTED     REPEATED TO VERIFY     HCT  Date Value Range Status  11/22/2012 29.0* 36.0 - 46.0 % Final    Physical Exam:  General: alert and cooperative Lochia: appropriate Uterine Fundus: firm Incision: perineum intact DVT Evaluation: No evidence of DVT seen on physical exam. No significant calf/ankle edema.  Discharge Diagnoses: Term Pregnancy-delivered  Discharge Information: Date: 11/24/2012 Activity: pelvic rest Diet: routine Medications: PNV, Ibuprofen and Percocet Condition: stable Instructions: refer to practice specific booklet Discharge to: home   Newborn Data: Live born female  Birth Weight: 7 lb 6.2 oz (3350 g) APGAR: 9, 9  Home with mother.  Victoria Weeks G 11/24/2012, 8:08 AM

## 2012-11-30 ENCOUNTER — Inpatient Hospital Stay (HOSPITAL_COMMUNITY): Admission: RE | Admit: 2012-11-30 | Payer: BC Managed Care – PPO | Source: Ambulatory Visit

## 2013-05-15 IMAGING — US US OB COMP LESS 14 WK
1 series · 14 of 23 positions shown · non-contrast
Comparison: None.

CLINICAL DATA: Spotting.  Pregnant.  Evaluate for ectopic
pregnancy.  6 weeks 1 day by last menstrual period.

OBSTETRIC <14 WK ULTRASOUND
TECHNIQUE: Transabdominal ultrasound was performed for evaluation
of the gestation as well as the maternal uterus and adnexal
regions.

[Series 1: us ob comp less 14 wks · 23 acquisitions, 14 frames shown]
[im 1/23]
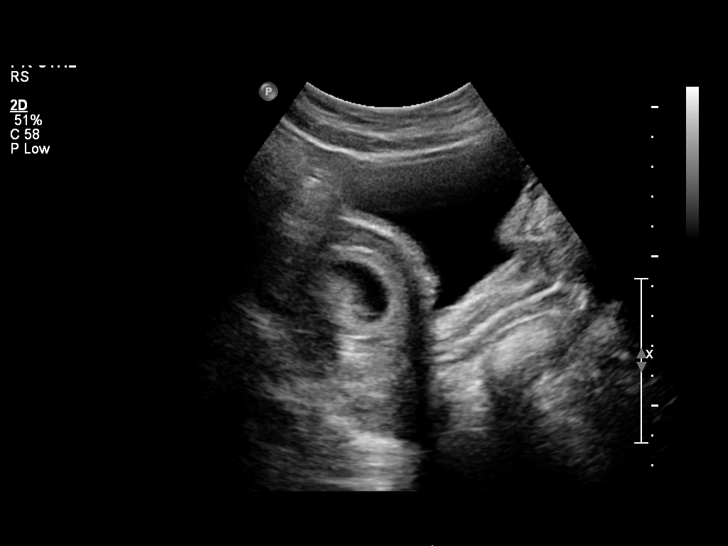
[im 3/23]
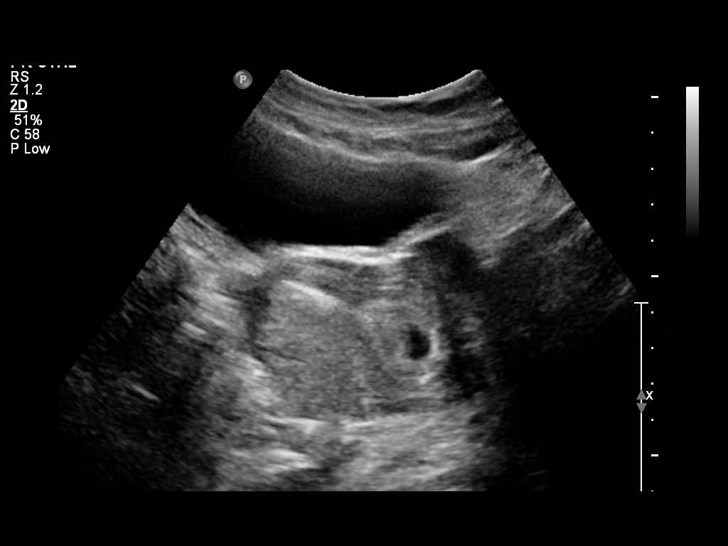
[im 5/23]
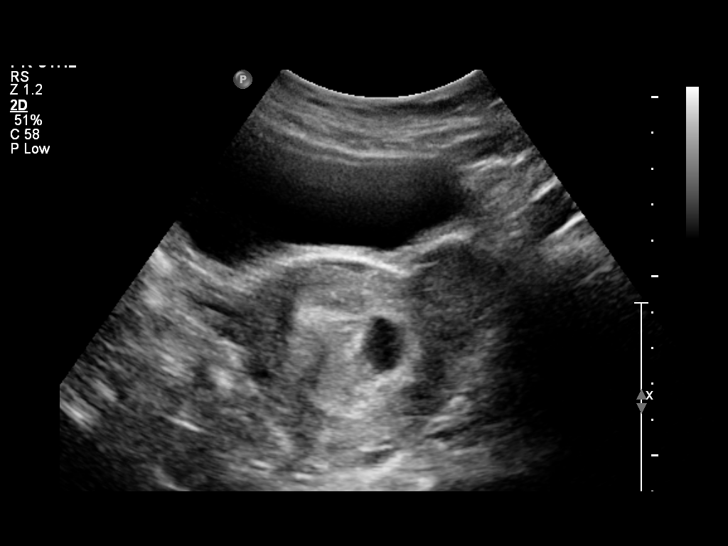
[im 6/23]
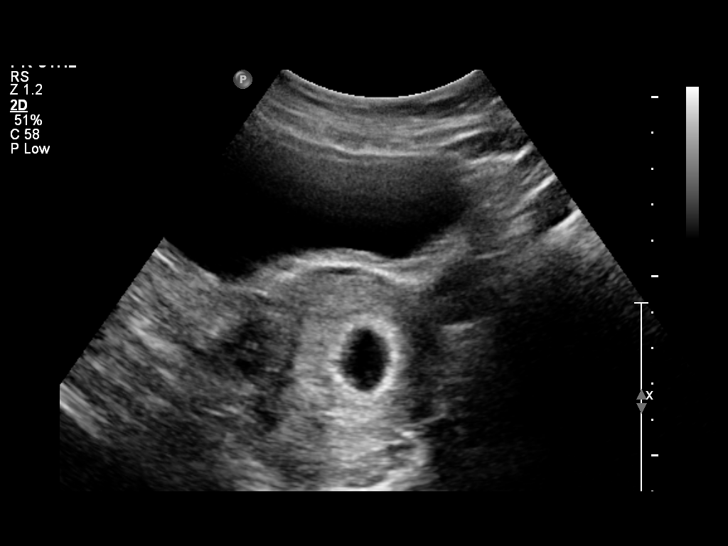
[im 8/23]
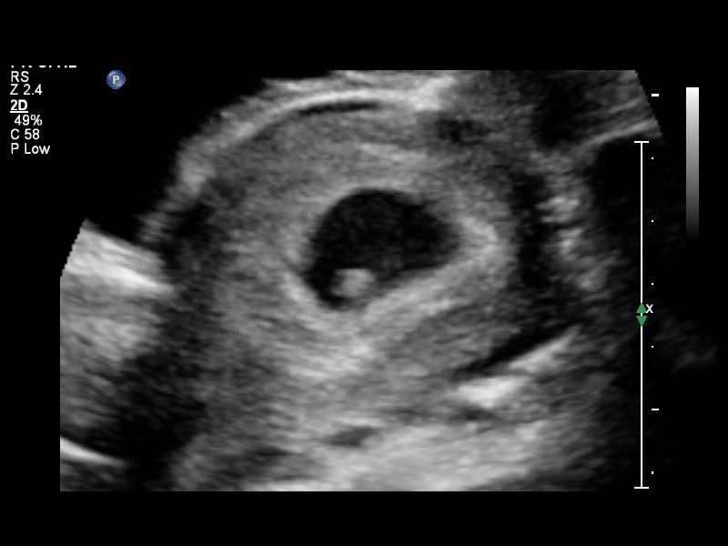
[im 10/23]
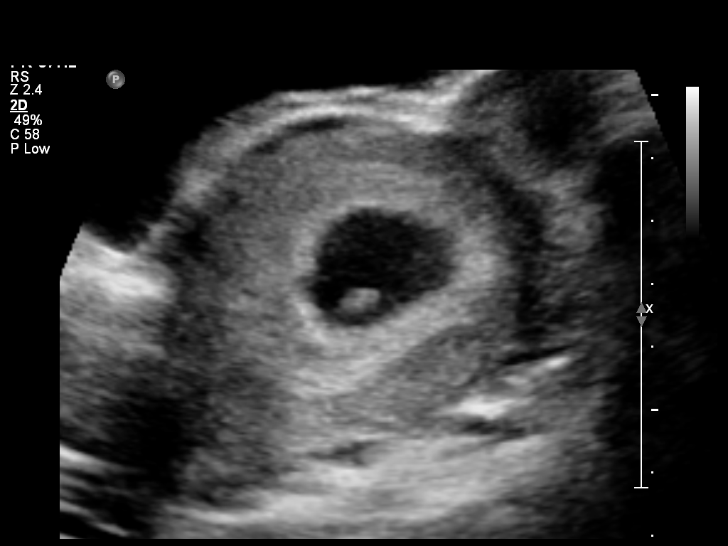
[im 11/23]
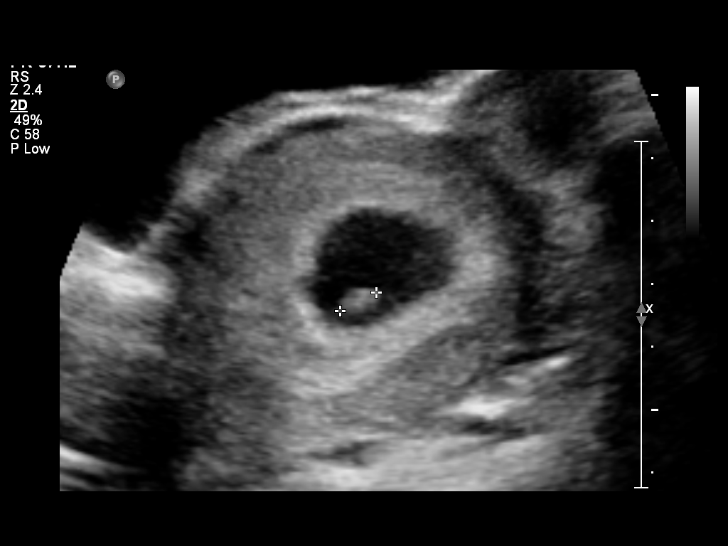
[im 13/23]
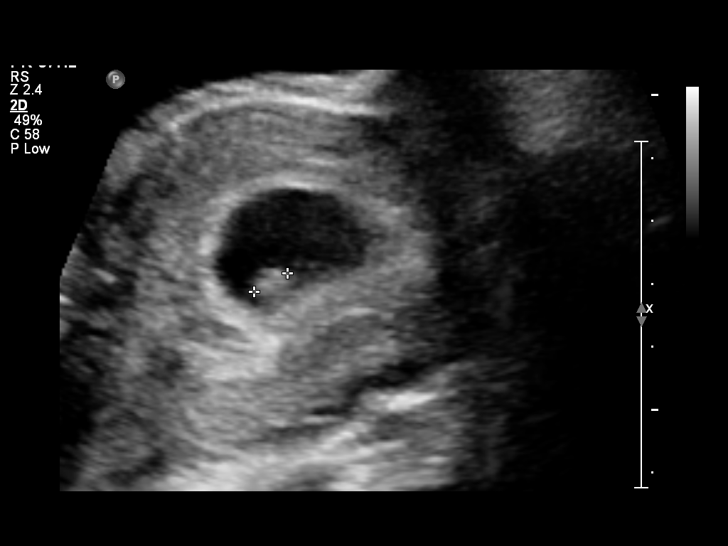
[im 14/23]
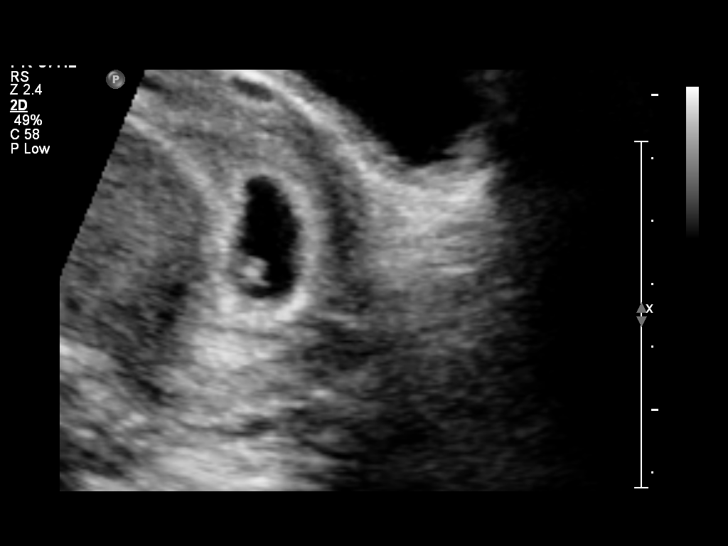
[im 16/23]
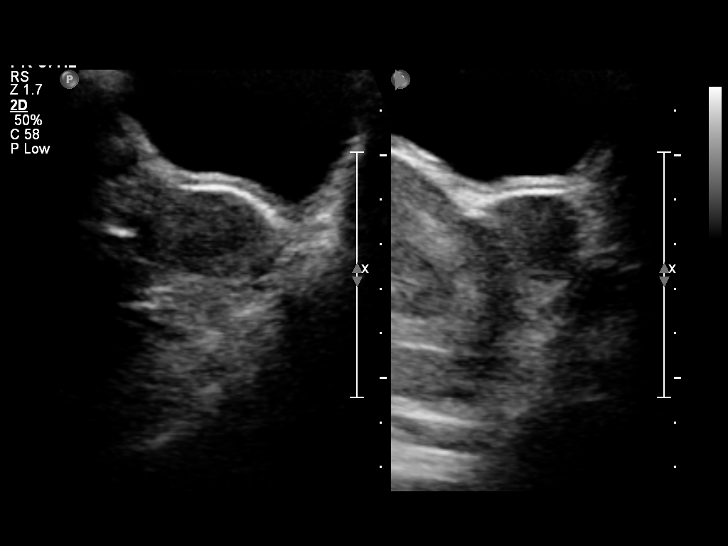
[im 18/23]
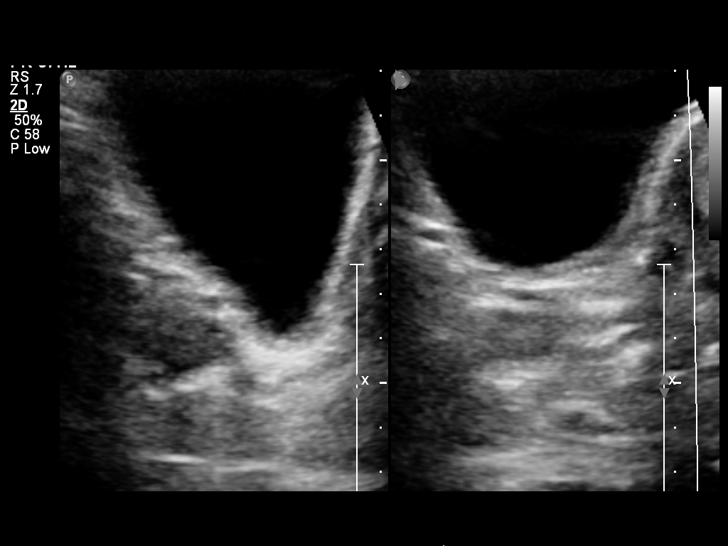
[im 19/23]
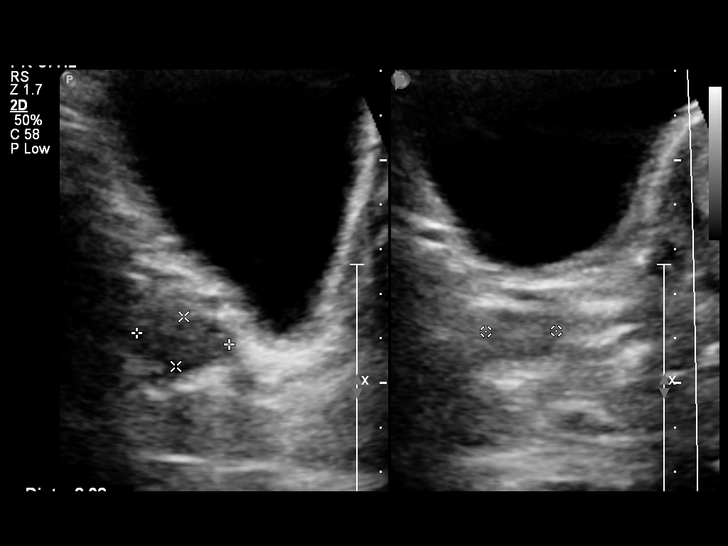
[im 21/23]
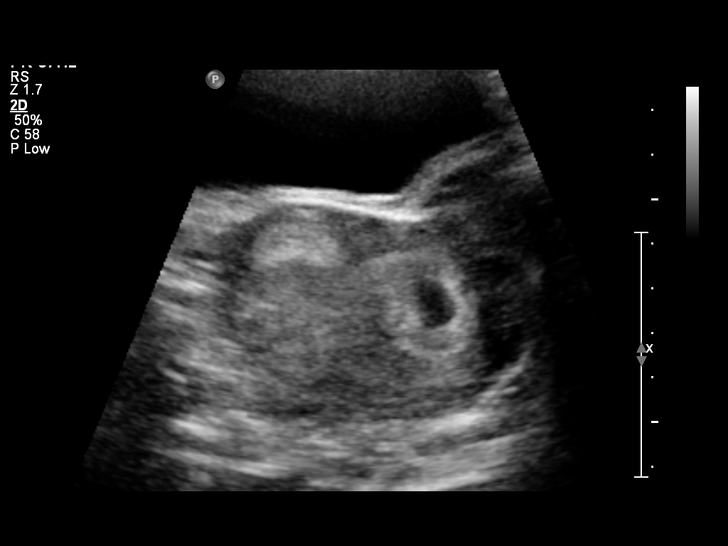
[im 23/23]
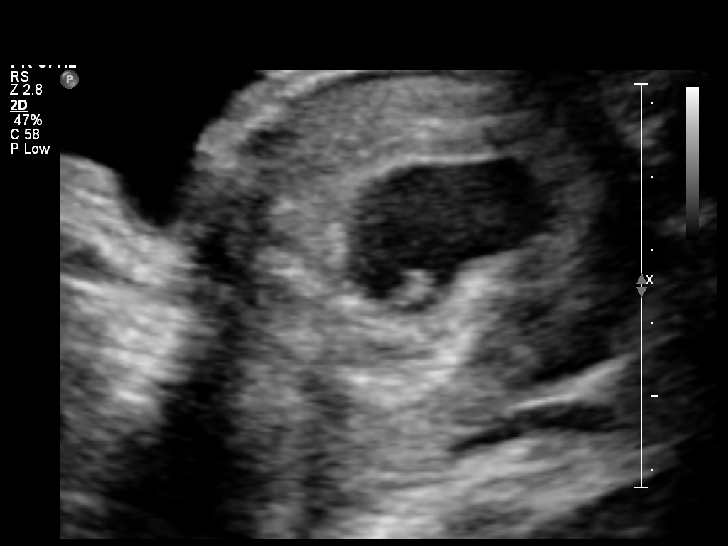

[14 of 23 positions shown; findings below may reference images not displayed]

Intrauterine gestational sac: Present.
Yolk sac: Present
Embryo: Present.
Cardiac Activity: Present; 117 beats per minute

CRL:  6 mm  6w  4d            US EDC: 11/21/2012

Maternal uterus/Adnexae:
Normal appearance of the ovaries. No significant free fluid.  No
subchorionic hemorrhage.
IMPRESSION: Intrauterine pregnancy of 6 weeks 6 days with fetal heart rate of
117 beats per minute.

## 2014-08-23 ENCOUNTER — Encounter (HOSPITAL_COMMUNITY): Payer: Self-pay | Admitting: Family Medicine

## 2016-05-22 LAB — OB RESULTS CONSOLE RUBELLA ANTIBODY, IGM: RUBELLA: IMMUNE

## 2016-05-22 LAB — OB RESULTS CONSOLE GC/CHLAMYDIA
CHLAMYDIA, DNA PROBE: NEGATIVE
GC PROBE AMP, GENITAL: NEGATIVE

## 2016-05-22 LAB — OB RESULTS CONSOLE HEPATITIS B SURFACE ANTIGEN: HEP B S AG: NEGATIVE

## 2016-05-22 LAB — OB RESULTS CONSOLE ABO/RH: RH Type: POSITIVE

## 2016-05-22 LAB — OB RESULTS CONSOLE ANTIBODY SCREEN: Antibody Screen: NEGATIVE

## 2016-05-22 LAB — OB RESULTS CONSOLE HIV ANTIBODY (ROUTINE TESTING): HIV: NONREACTIVE

## 2016-05-22 LAB — OB RESULTS CONSOLE RPR: RPR: NONREACTIVE

## 2016-10-22 NOTE — L&D Delivery Note (Signed)
Delivery Note At 11:32 AM a viable female was delivered via Vaginal, Spontaneous Delivery (presentation: OA;  ).  APGAR: 9, 9; weight pending Placenta status: routine. with the following complications: Nuchal cord x 1, reduced.  Cord pH: sent routine   Progressed to complete. Pushed to delivery a viable female infant in OA presentation. Apgars 9/9 and weight pending. Placenta not sent. No complications. Nuchal cord x 1 reduced at delivery. Second degree tear repaired in the usual fashion. No bleeding after. Rectal exam with intact rectovaginal septum.   Anesthesia:  CLE Episiotomy: None Lacerations: 2nd degree Suture Repair: 2.0 3.0 Est. Blood Loss (mL):  250cc  It's a boy - "Victoria Weeks"!! He joins his sister, Victoria Weeks.    Mom to postpartum.  Baby to Couplet care / Skin to Skin.   Victoria Etiennelise Jennifer Yahmir Weeks 12/17/2016, 12:08 PM

## 2016-12-05 ENCOUNTER — Encounter (HOSPITAL_COMMUNITY): Payer: Self-pay | Admitting: *Deleted

## 2016-12-05 ENCOUNTER — Telehealth (HOSPITAL_COMMUNITY): Payer: Self-pay | Admitting: *Deleted

## 2016-12-05 LAB — OB RESULTS CONSOLE GBS: GBS: NEGATIVE

## 2016-12-05 NOTE — Telephone Encounter (Signed)
Preadmission screen  

## 2016-12-14 MED ORDER — DEXTROSE IN LACTATED RINGERS 5 % IV SOLN
INTRAVENOUS | Status: DC
  Filled 2016-12-14: qty 1000

## 2016-12-17 ENCOUNTER — Inpatient Hospital Stay (HOSPITAL_COMMUNITY): Payer: BC Managed Care – PPO | Admitting: Anesthesiology

## 2016-12-17 ENCOUNTER — Inpatient Hospital Stay (HOSPITAL_COMMUNITY)
Admission: RE | Admit: 2016-12-17 | Discharge: 2016-12-18 | DRG: 775 | Disposition: A | Discharge status: Home / Self Care | Payer: BC Managed Care – PPO | Source: Ambulatory Visit | Attending: Obstetrics and Gynecology | Admitting: Obstetrics and Gynecology

## 2016-12-17 ENCOUNTER — Encounter (HOSPITAL_COMMUNITY): Payer: Self-pay

## 2016-12-17 VITALS — BP 91/66 | HR 72 | Temp 98.1°F | Resp 18 | Ht 66.0 in | Wt 162.0 lb

## 2016-12-17 DIAGNOSIS — O24425 Gestational diabetes mellitus in childbirth, controlled by oral hypoglycemic drugs: Secondary | ICD-10-CM | POA: Diagnosis present

## 2016-12-17 DIAGNOSIS — Z3A39 39 weeks gestation of pregnancy: Secondary | ICD-10-CM | POA: Diagnosis not present

## 2016-12-17 DIAGNOSIS — Z3490 Encounter for supervision of normal pregnancy, unspecified, unspecified trimester: Secondary | ICD-10-CM

## 2016-12-17 DIAGNOSIS — Z87891 Personal history of nicotine dependence: Secondary | ICD-10-CM | POA: Diagnosis not present

## 2016-12-17 DIAGNOSIS — Z8249 Family history of ischemic heart disease and other diseases of the circulatory system: Secondary | ICD-10-CM

## 2016-12-17 DIAGNOSIS — Z3A01 Less than 8 weeks gestation of pregnancy: Secondary | ICD-10-CM

## 2016-12-17 LAB — GLUCOSE, CAPILLARY
GLUCOSE-CAPILLARY: 89 mg/dL (ref 65–99)
Glucose-Capillary: 89 mg/dL (ref 65–99)
Glucose-Capillary: 92 mg/dL (ref 65–99)

## 2016-12-17 LAB — CBC
HEMATOCRIT: 36.2 % (ref 36.0–46.0)
HEMOGLOBIN: 12.3 g/dL (ref 12.0–15.0)
MCH: 30.3 pg (ref 26.0–34.0)
MCHC: 34 g/dL (ref 30.0–36.0)
MCV: 89.2 fL (ref 78.0–100.0)
Platelets: 208 10*3/uL (ref 150–400)
RBC: 4.06 MIL/uL (ref 3.87–5.11)
RDW: 14.1 % (ref 11.5–15.5)
WBC: 6.4 10*3/uL (ref 4.0–10.5)

## 2016-12-17 LAB — TYPE AND SCREEN
ABO/RH(D): A POS
Antibody Screen: NEGATIVE

## 2016-12-17 MED ORDER — OXYCODONE-ACETAMINOPHEN 5-325 MG PO TABS: 1.0000 | ORAL_TABLET | ORAL | Status: DC | PRN

## 2016-12-17 MED ORDER — OXYCODONE-ACETAMINOPHEN 5-325 MG PO TABS: 2.0000 | ORAL_TABLET | ORAL | Status: DC | PRN

## 2016-12-17 MED ORDER — TETANUS-DIPHTH-ACELL PERTUSSIS 5-2.5-18.5 LF-MCG/0.5 IM SUSP: 0.5000 mL | Freq: Once | INTRAMUSCULAR | Status: DC

## 2016-12-17 MED ORDER — ACETAMINOPHEN 325 MG PO TABS: 650.0000 mg | ORAL_TABLET | ORAL | Status: DC | PRN

## 2016-12-17 MED ORDER — FENTANYL 2.5 MCG/ML BUPIVACAINE 1/10 % EPIDURAL INFUSION (WH - ANES)
14.0000 mL/h | INTRAMUSCULAR | Status: DC | PRN
  Administered 2016-12-17 (×2): 10 mL/h via EPIDURAL
  Filled 2016-12-17: qty 100

## 2016-12-17 MED ORDER — SOD CITRATE-CITRIC ACID 500-334 MG/5ML PO SOLN: 30.0000 mL | ORAL | Status: DC | PRN

## 2016-12-17 MED ORDER — ONDANSETRON HCL 4 MG/2ML IJ SOLN: 4.0000 mg | INTRAMUSCULAR | Status: DC | PRN

## 2016-12-17 MED ORDER — OXYCODONE HCL 5 MG PO TABS: 10.0000 mg | ORAL_TABLET | ORAL | Status: DC | PRN

## 2016-12-17 MED ORDER — WITCH HAZEL-GLYCERIN EX PADS
1.0000 | MEDICATED_PAD | CUTANEOUS | Status: DC | PRN
Start: 2016-12-17 — End: 2016-12-18

## 2016-12-17 MED ORDER — PHENYLEPHRINE 40 MCG/ML (10ML) SYRINGE FOR IV PUSH (FOR BLOOD PRESSURE SUPPORT): 80.0000 ug | PREFILLED_SYRINGE | INTRAVENOUS | Status: DC | PRN

## 2016-12-17 MED ORDER — OXYTOCIN 40 UNITS IN LACTATED RINGERS INFUSION - SIMPLE MED: 2.5000 [IU]/h | INTRAVENOUS | Status: DC

## 2016-12-17 MED ORDER — ZOLPIDEM TARTRATE 5 MG PO TABS: 5.0000 mg | ORAL_TABLET | Freq: Every evening | ORAL | Status: DC | PRN

## 2016-12-17 MED ORDER — EPHEDRINE 5 MG/ML INJ: 10.0000 mg | INTRAVENOUS | Status: DC | PRN

## 2016-12-17 MED ORDER — OXYTOCIN BOLUS FROM INFUSION: 500.0000 mL | Freq: Once | INTRAVENOUS | Status: DC

## 2016-12-17 MED ORDER — LACTATED RINGERS IV SOLN
INTRAVENOUS | Status: DC
  Administered 2016-12-17: 01:00:00 via INTRAVENOUS

## 2016-12-17 MED ORDER — COCONUT OIL OIL: 1.0000 "application " | TOPICAL_OIL | Status: DC | PRN

## 2016-12-17 MED ORDER — BENZOCAINE-MENTHOL 20-0.5 % EX AERO
1.0000 "application " | INHALATION_SPRAY | CUTANEOUS | Status: DC | PRN
  Administered 2016-12-18: 1 via TOPICAL
  Filled 2016-12-17: qty 56

## 2016-12-17 MED ORDER — PHENYLEPHRINE 40 MCG/ML (10ML) SYRINGE FOR IV PUSH (FOR BLOOD PRESSURE SUPPORT)
80.0000 ug | PREFILLED_SYRINGE | INTRAVENOUS | Status: DC | PRN
  Filled 2016-12-17: qty 10
  Filled 2016-12-17: qty 5

## 2016-12-17 MED ORDER — LACTATED RINGERS IV SOLN
500.0000 mL | Freq: Once | INTRAVENOUS | Status: AC
  Administered 2016-12-17: 800 mL via INTRAVENOUS

## 2016-12-17 MED ORDER — DIPHENHYDRAMINE HCL 25 MG PO CAPS: 25.0000 mg | ORAL_CAPSULE | Freq: Four times a day (QID) | ORAL | Status: DC | PRN

## 2016-12-17 MED ORDER — OXYCODONE HCL 5 MG PO TABS: 5.0000 mg | ORAL_TABLET | ORAL | Status: DC | PRN

## 2016-12-17 MED ORDER — LIDOCAINE HCL (PF) 1 % IJ SOLN
INTRAMUSCULAR | Status: DC | PRN
  Administered 2016-12-17: 4 mL via EPIDURAL

## 2016-12-17 MED ORDER — EPHEDRINE 5 MG/ML INJ
10.0000 mg | INTRAVENOUS | Status: DC | PRN
  Filled 2016-12-17: qty 4

## 2016-12-17 MED ORDER — LACTATED RINGERS IV SOLN: 500.0000 mL | Freq: Once | INTRAVENOUS | Status: DC

## 2016-12-17 MED ORDER — SENNOSIDES-DOCUSATE SODIUM 8.6-50 MG PO TABS
2.0000 | ORAL_TABLET | ORAL | Status: DC
  Administered 2016-12-17: 2 via ORAL
  Filled 2016-12-17 (×2): qty 2

## 2016-12-17 MED ORDER — TERBUTALINE SULFATE 1 MG/ML IJ SOLN
0.2500 mg | Freq: Once | INTRAMUSCULAR | Status: DC | PRN
  Filled 2016-12-17: qty 1

## 2016-12-17 MED ORDER — ONDANSETRON HCL 4 MG/2ML IJ SOLN: 4.0000 mg | Freq: Four times a day (QID) | INTRAMUSCULAR | Status: DC | PRN

## 2016-12-17 MED ORDER — IBUPROFEN 600 MG PO TABS
600.0000 mg | ORAL_TABLET | Freq: Four times a day (QID) | ORAL | Status: DC
  Administered 2016-12-17 – 2016-12-18 (×4): 600 mg via ORAL
  Filled 2016-12-17 (×4): qty 1

## 2016-12-17 MED ORDER — ONDANSETRON HCL 4 MG PO TABS: 4.0000 mg | ORAL_TABLET | ORAL | Status: DC | PRN

## 2016-12-17 MED ORDER — SIMETHICONE 80 MG PO CHEW: 80.0000 mg | CHEWABLE_TABLET | ORAL | Status: DC | PRN

## 2016-12-17 MED ORDER — OXYTOCIN 40 UNITS IN LACTATED RINGERS INFUSION - SIMPLE MED
1.0000 m[IU]/min | INTRAVENOUS | Status: DC
  Administered 2016-12-17: 2 m[IU]/min via INTRAVENOUS
  Filled 2016-12-17: qty 1000

## 2016-12-17 MED ORDER — LIDOCAINE HCL (PF) 1 % IJ SOLN
30.0000 mL | INTRAMUSCULAR | Status: DC | PRN
  Filled 2016-12-17: qty 30

## 2016-12-17 MED ORDER — LACTATED RINGERS IV SOLN: 500.0000 mL | INTRAVENOUS | Status: DC | PRN

## 2016-12-17 MED ORDER — DIPHENHYDRAMINE HCL 50 MG/ML IJ SOLN: 12.5000 mg | INTRAMUSCULAR | Status: DC | PRN

## 2016-12-17 MED ORDER — PHENYLEPHRINE 40 MCG/ML (10ML) SYRINGE FOR IV PUSH (FOR BLOOD PRESSURE SUPPORT)
80.0000 ug | PREFILLED_SYRINGE | INTRAVENOUS | Status: DC | PRN
  Filled 2016-12-17: qty 5

## 2016-12-17 MED ORDER — DIBUCAINE 1 % RE OINT: 1.0000 "application " | TOPICAL_OINTMENT | RECTAL | Status: DC | PRN

## 2016-12-17 MED ORDER — PRENATAL MULTIVITAMIN CH
1.0000 | ORAL_TABLET | Freq: Every day | ORAL | Status: DC
  Administered 2016-12-18: 1 via ORAL

## 2016-12-17 NOTE — Anesthesia Pain Management Evaluation Note (Signed)
  CRNA Pain Management Visit Note  Patient: Victoria Weeks, 36 y.o., female  "Hello I am a member of the anesthesia team at [facility]. We have an anesthesia team available at all times to provide care throughout the hospital, including epidural management and anesthesia for C-section. I don't know your plan for the delivery whether it a natural birth, water birth, IV sedation, nitrous supplementation, doula or epidural, but we want to meet your pain goals."   1.Was your pain managed to your expectations on prior hospitalizations?   Yes   2.What is your expectation for pain management during this hospitalization?     Epidural  3.How can we help you reach that goal? epidural  Record the patient's initial score and the patient's pain goal.   Pain: 7  Pain Goal: 4 [facility] wants you to be able to say your pain was always managed very well.  Victoria HickmanGREGORY,Hasaan Radde 12/17/2016

## 2016-12-17 NOTE — H&P (Signed)
Victoria Weeks is a 36 y.o. female presenting for contractions, IOL scheduled this Am. Pregnancy c/b A2GDM, on glyburide.  She has a h/o previous child being admitted for E.Coli sepsis several days after being discharged from the hospital after delivery. GBS is negative during this pregnancy.  OB History    Gravida Para Term Preterm AB Living   3 1 1  0 1 1   SAB TAB Ectopic Multiple Live Births   0 1 0 0 1     Past Medical History:  Diagnosis Date  . Attention deficit disorder (ADD)   . B12 deficiency   . Diabetes mellitus without complication (HCC)   . GAD (generalized anxiety disorder)   . Gestational diabetes   . Major depression   . Panic attacks    Past Surgical History:  Procedure Laterality Date  . MOUTH SURGERY     Family History: family history includes Cancer in her maternal grandfather and paternal grandmother; Hypertension in her father and paternal grandfather. Social History:  reports that she has quit smoking. Her smoking use included Cigarettes. She has never used smokeless tobacco. She reports that she does not drink alcohol or use drugs.     Maternal Diabetes: Yes:  Diabetes Type:  Insulin/Medication controlled Genetic Screening: Normal Maternal Ultrasounds/Referrals: Normal Fetal Ultrasounds or other Referrals:  None Maternal Substance Abuse:  No Significant Maternal Medications:  None Significant Maternal Lab Results:  None Other Comments:  None  ROS History Dilation: 4 Effacement (%): 70 Station: -3 Exam by:: Dr. Elon SpannerLeger Blood pressure 105/64, pulse 72, temperature 97.2 F (36.2 C), temperature source Oral, resp. rate 16, height 5\' 6"  (1.676 m), weight 73.5 kg (162 lb), last menstrual period 03/19/2016, SpO2 98 %, unknown if currently breastfeeding. Exam Physical Exam  Prenatal labs: ABO, Rh: --/--/A POS (02/26 0055) Antibody: NEG (02/26 0055) Rubella: Immune (08/01 0000) RPR: Nonreactive (08/01 0000)  HBsAg: Negative (08/01 0000)  HIV:  Non-reactive (08/01 0000)  GBS: Negative (02/14 0000)   Assessment/Plan: 35yo G3P1011 @ 39.0 wga presenting for IOL.  A2GDM protocol intrapartum Pitocin. S/p AROM for clear fluid. EFW 8#, cat 1 tracing.   GBS neg.   Victoria Etiennelise Jennifer Brylen Weeks 12/17/2016, 9:12 AM

## 2016-12-17 NOTE — Anesthesia Postprocedure Evaluation (Addendum)
Anesthesia Post Note  Patient: Victoria Weeks  Procedure(s) Performed: * No procedures listed *  Patient location during evaluation: Mother Baby Anesthesia Type: Epidural Level of consciousness: awake and alert Pain management: pain level controlled Vital Signs Assessment: post-procedure vital signs reviewed and stable Respiratory status: spontaneous breathing, nonlabored ventilation and respiratory function stable Cardiovascular status: stable Postop Assessment: no headache, no backache and epidural receding Anesthetic complications: no        Last Vitals:  Vitals:   12/17/16 1435 12/17/16 1825  BP: 99/61 115/70  Pulse: 94 72  Resp: 20 20  Temp: 37 C 37 C    Last Pain:  Vitals:   12/17/16 1825  TempSrc: Oral  PainSc:    Pain Goal:                 EchoStarMERRITT,DEBRA

## 2016-12-17 NOTE — Lactation Note (Signed)
This note was copied from a baby's chart. Lactation Consultation Note  Patient Name: Boy Cathie Oldenmily Zee NWGNF'AToday's Date: 12/17/2016 Reason for consult: Initial assessment Baby at 5 hr of life. Mom bf for 2752m with no issues with her older child. She reports this baby is latching well. She denies breast or nipple pain, voiced no concerns. Baby was coming off the breast upon entry. No latch we observed. Discussed baby behavior, feeding frequency, baby belly size, voids, wt loss, breast changes, and nipple care. Mom stated she can manually express and has a spoon in the room. Given lactation handouts. Aware of OP services and support group.  Mom will offer the breast on demand 8+/24hr. She will manually express and spoon feed per volume guidelines as needed.      Maternal Data Has patient been taught Hand Expression?: Yes Does the patient have breastfeeding experience prior to this delivery?: Yes  Feeding Feeding Type: Breast Fed Length of feed: 10 min  LATCH Score/Interventions Latch:  (baby was coming off the breast upon entry) Intervention(s): Assist with latch;Breast massage;Adjust position  Audible Swallowing: A few with stimulation Intervention(s): Skin to skin  Type of Nipple: Everted at rest and after stimulation  Comfort (Breast/Nipple): Soft / non-tender     Hold (Positioning): Assistance needed to correctly position infant at breast and maintain latch.  LATCH Score: 7  Lactation Tools Discussed/Used WIC Program: No   Consult Status Consult Status: Follow-up Date: 12/18/16 Follow-up type: In-patient    Rulon Eisenmengerlizabeth E Callahan Wild 12/17/2016, 4:55 PM

## 2016-12-17 NOTE — Anesthesia Preprocedure Evaluation (Signed)
Anesthesia Evaluation  Patient identified by MRN, date of birth, ID band Patient awake    Reviewed: Allergy & Precautions, NPO status , Patient's Chart, lab work & pertinent test results  Airway Mallampati: I       Dental   Pulmonary former smoker,    breath sounds clear to auscultation       Cardiovascular negative cardio ROS   Rhythm:Regular Rate:Normal     Neuro/Psych PSYCHIATRIC DISORDERS Anxiety Depression negative neurological ROS     GI/Hepatic negative GI ROS, Neg liver ROS,   Endo/Other  diabetes, Type 2, Oral Hypoglycemic Agents  Renal/GU negative Renal ROS  negative genitourinary   Musculoskeletal negative musculoskeletal ROS (+)   Abdominal   Peds negative pediatric ROS (+)  Hematology negative hematology ROS (+)   Anesthesia Other Findings   Reproductive/Obstetrics negative OB ROS                             Lab Results  Component Value Date   WBC 6.4 12/17/2016   HGB 12.3 12/17/2016   HCT 36.2 12/17/2016   MCV 89.2 12/17/2016   PLT 208 12/17/2016   No results found for: INR, PROTIME   Anesthesia Physical Anesthesia Plan  ASA: II  Anesthesia Plan: Epidural   Post-op Pain Management:    Induction:   Airway Management Planned:   Additional Equipment:   Intra-op Plan:   Post-operative Plan:   Informed Consent: I have reviewed the patients History and Physical, chart, labs and discussed the procedure including the risks, benefits and alternatives for the proposed anesthesia with the patient or authorized representative who has indicated his/her understanding and acceptance.     Plan Discussed with:   Anesthesia Plan Comments:         Anesthesia Quick Evaluation

## 2016-12-17 NOTE — Anesthesia Procedure Notes (Signed)
Epidural Patient location during procedure: OB Start time: 12/17/2016 8:25 AM End time: 12/17/2016 8:33 AM  Staffing Anesthesiologist: Shona SimpsonHOLLIS, Harith Mccadden D Performed: anesthesiologist   Preanesthetic Checklist Completed: patient identified, site marked, surgical consent, pre-op evaluation, timeout performed, IV checked, risks and benefits discussed and monitors and equipment checked  Epidural Patient position: sitting Prep: ChloraPrep Patient monitoring: heart rate, continuous pulse ox and blood pressure Approach: midline Location: L3-L4 Injection technique: LOR saline  Needle:  Needle type: Tuohy  Needle gauge: 17 G Needle length: 9 cm Catheter type: closed end flexible Catheter size: 20 Guage Test dose: negative and 1.5% lidocaine  Assessment Events: blood not aspirated, injection not painful, no injection resistance and no paresthesia  Additional Notes LOR @ 5  Patient identified. Risks/Benefits/Options discussed with patient including but not limited to bleeding, infection, nerve damage, paralysis, failed block, incomplete pain control, headache, blood pressure changes, nausea, vomiting, reactions to medications, itching and postpartum back pain. Confirmed with bedside nurse the patient's most recent platelet count. Confirmed with patient that they are not currently taking any anticoagulation, have any bleeding history or any family history of bleeding disorders. Patient expressed understanding and wished to proceed. All questions were answered. Sterile technique was used throughout the entire procedure. Please see nursing notes for vital signs. Test dose was given through epidural catheter and negative prior to continuing to dose epidural or start infusion. Warning signs of high block given to the patient including shortness of breath, tingling/numbness in hands, complete motor block, or any concerning symptoms with instructions to call for help. Patient was given instructions on fall  risk and not to get out of bed. All questions and concerns addressed with instructions to call with any issues or inadequate analgesia.    Reason for block:procedure for pain

## 2016-12-18 LAB — CBC
HCT: 29.4 % — ABNORMAL LOW (ref 36.0–46.0)
Hemoglobin: 9.8 g/dL — ABNORMAL LOW (ref 12.0–15.0)
MCH: 30.1 pg (ref 26.0–34.0)
MCHC: 33.3 g/dL (ref 30.0–36.0)
MCV: 90.2 fL (ref 78.0–100.0)
Platelets: 180 10*3/uL (ref 150–400)
RBC: 3.26 MIL/uL — AB (ref 3.87–5.11)
RDW: 13.8 % (ref 11.5–15.5)
WBC: 10 10*3/uL (ref 4.0–10.5)

## 2016-12-18 LAB — RPR: RPR Ser Ql: NONREACTIVE

## 2016-12-18 MED ORDER — IBUPROFEN 600 MG PO TABS: 600.0000 mg | ORAL_TABLET | Freq: Four times a day (QID) | ORAL | 0 refills | Status: AC

## 2016-12-18 NOTE — Discharge Summary (Signed)
Obstetric Discharge Summary Reason for Admission: onset of labor Prenatal Procedures: ultrasound Intrapartum Procedures: spontaneous vaginal delivery Postpartum Procedures: none Complications-Operative and Postpartum: none Hemoglobin  Date Value Ref Range Status  12/18/2016 9.8 (L) 12.0 - 15.0 g/dL Final    Comment:    DELTA CHECK NOTED REPEATED TO VERIFY    HCT  Date Value Ref Range Status  12/18/2016 29.4 (L) 36.0 - 46.0 % Final    Physical Exam:  General: alert and cooperative Lochia: appropriate Uterine Fundus: firm Incision: no significant drainage DVT Evaluation: No evidence of DVT seen on physical exam.  Discharge Diagnoses: Term Pregnancy-delivered  Discharge Information: Date: 12/18/2016 Activity: pelvic rest Diet: routine Medications: PNV and Ibuprofen Condition: stable Instructions: refer to practice specific booklet Discharge to: home   Newborn Data: Live born female  Birth Weight: 8 lb 12 oz (3970 g) APGAR: 9, 9  Home with mother.  Ameet Sandy 12/18/2016, 8:46 AM

## 2016-12-18 NOTE — Lactation Note (Signed)
This note was copied from a baby's chart. Lactation Consultation Note  Patient Name: Victoria Cathie Oldenmily Shimmin OZHYQ'MToday's Date: 12/18/2016 Reason for consult: Follow-up assessment;Infant weight loss (2% weignt loss ) - post circ  Baby is 23 hours old and has been to the breast consistently ( latch scores have improved 7-8 )  Baby came back from circ and was awake, and showing feeding cues, LC assisted with latch  To obtain depth. @ 1st mom trying to latch in the cradle position and shallow latch. Mom receptive to trying the cross cradle and then switching her arms to the cradle, depth achieved, and multiple swallows noted. LC stressed the importance of achieving depth every feeding so the baby gets use to being  Close to the mom , also the baby has a recessed chin . LC shoed dad how he can assist mom with positioning and depth.  Per mom nipples seem sensitive initially and improve , LC assessed prior to feeding , no breakdown noted, just pink. Encouraged mom to use her EBM to nipples liberally.  Sore nipple and engorgement prevention and tx reviewed. LC instructed mom  On the use hand pump and shells. Per mom has  DEBP at home and used the #24 Flange with her 1st baby until she was really sore nipples and mastitis. LC explained to mom why it was recommended at that time to increase flange and then decrease flange back down to #24.  Mother informed of post-discharge support and given phone number to the lactation department, including services for phone call assistance; out-patient appointments; and breastfeeding support group. List of other breastfeeding resources in the community given in the handout. Encouraged mother to call for problems or concerns related to breastfeeding. LC discussed with mom and dad potential sluggishness with feedings today post circ.  And also importance of STS feedings until the baby can stay awake for a feeding and gaining steadily.     Maternal Data Has patient been taught Hand  Expression?: Yes  Feeding Feeding Type: Breast Fed Length of feed: 7 min (multiple swallows noted )  LATCH Score/Interventions Latch: Repeated attempts needed to sustain latch, nipple held in mouth throughout feeding, stimulation needed to elicit sucking reflex. Intervention(s): Adjust position;Assist with latch;Breast massage;Breast compression  Audible Swallowing: Spontaneous and intermittent  Type of Nipple: Everted at rest and after stimulation  Comfort (Breast/Nipple): Soft / non-tender     Hold (Positioning): Assistance needed to correctly position infant at breast and maintain latch. Intervention(s): Breastfeeding basics reviewed  LATCH Score: 8  Lactation Tools Discussed/Used Tools: Shells;Pump Shell Type: Inverted Breast pump type: Manual Pump Review: Setup, frequency, and cleaning Initiated by:: MAI  Date initiated:: 12/18/16   Consult Status Consult Status: Complete Date: 12/18/16 Follow-up type: In-patient    [name] 12/18/2016, 10:37 AM

## 2016-12-18 NOTE — Progress Notes (Signed)
MOB was referred for history of depression/anxiety. * Referral screened out by Clinical Social Worker because none of the following criteria appear to apply: ~ History of anxiety/depression during this pregnancy, or of post-partum depression. ~ Diagnosis of anxiety and/or depression within last 3 years OR * MOB's symptoms currently being treated with medication and/or therapy. Please contact the Clinical Social Worker if needs arise, or if MOB requests.  MOB's medical record notes hx of Anx/Dep "in college."  Patient is now 36 years old.

## 2016-12-21 ENCOUNTER — Ambulatory Visit: Payer: Self-pay

## 2016-12-21 NOTE — Lactation Note (Signed)
This note was copied from a baby's chart. Lactation Consult Weight today 3572 g  7 # 14.0 oz with clean diaper. Victoria Weeks had big burp and spit up with milk coming out of his nose earlier today. Parents very concerned- they have talked with nurse at [facility] office. First baby had to be admitted to [facility] after DC and they are very nervous about this baby, Victoria Weeks latched well but sleepy since he had fed before coming to appointment, Mom reports hearing big gulps and breast is softer after nursing.  Breasts are comfortable at this time. Reviewed engorgement prevention and treatment. Has Medela pump and pumped earlier today. Much reassurance given. Mom kept asking about giving formula. Encouraged to give breast milk if any supplement is needed since she has plenty of milk. But not needed at this time. Reviewed milk storage guidelines. Encouraged mom to get some rest- she is exhausted. No further questions at present To see Ped on Sunday,. Encouraged to call prn with questions/concerns  Mother's reason for visit:  Here because she wants to make sure breast feeding is going well Visit Type:  Feeding assessment Appointment Notes:  Some jaundice Consult:  Initial Lactation Consultant:  Pamelia Hoit  ________________________________________________________________________  Joan Flores Name:  Elspeth Cho Bottger Date of Birth:  12/17/2014 Pediatrician:  Orvan Falconer Gender:  female Gestational Age: [redacted]w[redacted]d (At Birth) Birth Weight:  8 lb 12 oz (3970 g)  ________________________________________________________________________  Mother's Name: Elspeth Cho Derksen Type of delivery:  Vaginal, Spontaneous Delivery Breastfeeding Experience:  P2 BF for 7 months Maternal Medications:  Ibuprofin  ________________________________________________________________________  Breastfeeding History (Post Discharge)  Frequency of breastfeeding:  q 2-3 hours Duration of feeding:  30-45 min    Pumping  Type of pump:  Medela  pump in style Frequency:  For comfort Volume:  30 ml  Infant Intake and Output Assessment  Voids:  5+ in 24 hrs.  Color:  Clear yellow Stools:  6+ in 24 hrs.  Color:  Yellow  ________________________________________________________________________  Maternal Breast Assessment  Breast:  Filling Nipple:  Erect Pain level:  A little tender  _______________________________________________________________________ Feeding Assessment/Evaluation  Initial feeding assessment:  Positioning:  Cradle Left breast  LATCH documentation:  Latch:  2 = Grasps breast easily, tongue down, lips flanged, rhythmical sucking.  Audible swallowing:  1 = A few with stimulation  Type of nipple:  2 = Everted at rest and after stimulation  Comfort (Breast/Nipple):  1 = Filling, red/small blisters or bruises, mild/mod discomfort  Hold (Positioning):  1 = Assistance needed to correctly position infant at breast and maintain latch  LATCH score:  7  Attached assessment:  Deep  Lips flanged:  Yes.    Lips untucked:  Yes.    Suck assessment:  Nonnutritive   Pre-feed weight:  3572 g 7 # 14.0 oz Post-feed weight:  3574 g  7 # 14.1 oz Amount transferred:  2 ml  Victoria Weeks had fed at home before coming to appointment for 20 min. He latched well here but sleepy at the breast. Few swallows noted.  Victoria Weeks had big burp and spit up with milk coming out of his nose earlier today. Mom's milk is coming since last night,. Reports she felt very full this morning but is comfortable this afternoon. Has pumped once for comfort.     Pre-feed weight:  3574 g   7 # 14.1 oz Post-feed weight:  3594 g  7 # 14.8 oz Amount transferred:  20 ml  Victoria Weeks latched well but again sleepy  at the breast because he had fed before coming to appointment. Mom reports breast feels some softer.  Total amount pumped post feed: Does not feel need to pump at this time,    Total amount transferred:  22 ml  Had fed for 20 min before coming to  appointment. Mom reports hearing lots of big gulps.

## 2016-12-24 ENCOUNTER — Inpatient Hospital Stay (HOSPITAL_COMMUNITY)
Admission: AD | Admit: 2016-12-24 | Payer: BC Managed Care – PPO | Source: Ambulatory Visit | Admitting: Obstetrics and Gynecology

## 2017-03-22 NOTE — Addendum Note (Signed)
Addendum  created 03/22/17 1040 by Shelton SilvasHollis, Daxx Tiggs D, MD   Sign clinical note

## 2018-02-24 ENCOUNTER — Other Ambulatory Visit: Payer: Self-pay | Admitting: Obstetrics and Gynecology

## 2018-02-24 DIAGNOSIS — N6320 Unspecified lump in the left breast, unspecified quadrant: Secondary | ICD-10-CM

## 2018-03-06 ENCOUNTER — Ambulatory Visit
Admission: RE | Admit: 2018-03-06 | Discharge: 2018-03-06 | Disposition: A | Discharge status: Home / Self Care | Payer: BC Managed Care – PPO | Source: Ambulatory Visit | Attending: Obstetrics and Gynecology | Admitting: Obstetrics and Gynecology

## 2018-03-06 ENCOUNTER — Other Ambulatory Visit: Payer: Self-pay | Admitting: Obstetrics and Gynecology

## 2018-03-06 ENCOUNTER — Ambulatory Visit: Payer: BC Managed Care – PPO

## 2018-03-06 DIAGNOSIS — N6320 Unspecified lump in the left breast, unspecified quadrant: Secondary | ICD-10-CM

## 2018-03-06 DIAGNOSIS — N6310 Unspecified lump in the right breast, unspecified quadrant: Secondary | ICD-10-CM

## 2020-07-25 ENCOUNTER — Other Ambulatory Visit: Payer: BC Managed Care – PPO

## 2020-07-25 ENCOUNTER — Other Ambulatory Visit: Payer: Self-pay

## 2020-07-25 DIAGNOSIS — Z20822 Contact with and (suspected) exposure to covid-19: Secondary | ICD-10-CM

## 2020-07-27 LAB — NOVEL CORONAVIRUS, NAA: SARS-CoV-2, NAA: NOT DETECTED

## 2020-07-27 LAB — SARS-COV-2, NAA 2 DAY TAT

## 2021-03-24 ENCOUNTER — Other Ambulatory Visit: Payer: Self-pay

## 2021-03-24 ENCOUNTER — Emergency Department (HOSPITAL_BASED_OUTPATIENT_CLINIC_OR_DEPARTMENT_OTHER)
Admission: EM | Admit: 2021-03-24 | Discharge: 2021-03-24 | Disposition: A | Discharge status: Home / Self Care | Payer: BC Managed Care – PPO | Attending: Emergency Medicine | Admitting: Emergency Medicine

## 2021-03-24 DIAGNOSIS — S59902A Unspecified injury of left elbow, initial encounter: Secondary | ICD-10-CM | POA: Diagnosis present

## 2021-03-24 DIAGNOSIS — L03114 Cellulitis of left upper limb: Secondary | ICD-10-CM | POA: Diagnosis not present

## 2021-03-24 DIAGNOSIS — Z87891 Personal history of nicotine dependence: Secondary | ICD-10-CM | POA: Diagnosis not present

## 2021-03-24 DIAGNOSIS — S50362A Insect bite (nonvenomous) of left elbow, initial encounter: Secondary | ICD-10-CM | POA: Insufficient documentation

## 2021-03-24 DIAGNOSIS — E119 Type 2 diabetes mellitus without complications: Secondary | ICD-10-CM | POA: Diagnosis not present

## 2021-03-24 DIAGNOSIS — W57XXXA Bitten or stung by nonvenomous insect and other nonvenomous arthropods, initial encounter: Secondary | ICD-10-CM | POA: Diagnosis not present

## 2021-03-24 MED ORDER — DOXYCYCLINE HYCLATE 100 MG PO CAPS: 100.0000 mg | ORAL_CAPSULE | Freq: Two times a day (BID) | ORAL | 0 refills | Status: AC

## 2021-03-24 MED ORDER — DOXYCYCLINE HYCLATE 100 MG PO TABS
100.0000 mg | ORAL_TABLET | Freq: Once | ORAL | Status: AC
  Administered 2021-03-24: 100 mg via ORAL
  Filled 2021-03-24: qty 1

## 2021-03-24 NOTE — ED Notes (Signed)
Line drawn around cellulitis to look for growth.

## 2021-03-24 NOTE — ED Provider Notes (Signed)
[facility] EMERGENCY DEPT Provider Note   CSN: [id number] Arrival date & time: 03/24/21  1906     History Chief Complaint  Patient presents with  . Rash    Victoria Weeks is a 40 y.o. female.  The history is provided by the patient.  Rash Location:  Shoulder/arm Shoulder/arm rash location:  L elbow Quality: draining, painful and redness   Pain details:    Quality:  Throbbing   Severity:  Moderate   Onset quality:  Gradual   Duration:  2 days   Timing:  Constant   Progression:  Worsening Onset quality:  Gradual Duration:  2 days Timing:  Constant Progression:  Worsening Chronicity:  New Context: insect bite/sting   Relieved by:  Nothing Worsened by:  Nothing Ineffective treatments:  Antibiotic cream Associated symptoms: no abdominal pain, no fever, no joint pain, no nausea, no shortness of breath, no sore throat and not vomiting        Past Medical History:  Diagnosis Date  . Attention deficit disorder (ADD)   . B12 deficiency   . Diabetes mellitus without complication (HCC)   . GAD (generalized anxiety disorder)   . Gestational diabetes   . Major depression   . Panic attacks     Patient Active Problem List   Diagnosis Date Noted  . Pregnancy 12/17/2016    Past Surgical History:  Procedure Laterality Date  . MOUTH SURGERY       OB History    Gravida  3   Para  2   Term  2   Preterm  0   AB  1   Living  2     SAB  0   IAB  1   Ectopic  0   Multiple  0   Live Births  2           Family History  Problem Relation Age of Onset  . Hypertension Father   . Cancer Maternal Grandfather        colon  . Cancer Paternal Grandmother        lung  . Hypertension Paternal Grandfather     Social History   Tobacco Use  . Smoking status: Former Smoker    Types: Cigarettes  . Smokeless tobacco: Never Used  . Tobacco comment: quit ist of june 2013  Substance Use Topics  . Alcohol use: No  . Drug use: No    Home  Medications Prior to Admission medications   Medication Sig Start Date End Date Taking? Authorizing Provider  doxycycline (VIBRAMYCIN) 100 MG capsule Take 1 capsule (100 mg total) by mouth 2 (two) times daily for 7 days. 03/24/21 03/31/21 Yes Koleen Distance, MD  Cholecalciferol (VITAMIN D PO) Take 1 tablet by mouth daily.    [provider]  ibuprofen (ADVIL,MOTRIN) 600 MG tablet Take 1 tablet (600 mg total) by mouth every 6 (six) hours. 12/18/16   Zelphia Cairo, MD  Prenatal Vit-Fe Fumarate-FA (PRENATAL MULTIVITAMIN) TABS Take 1 tablet by mouth daily with lunch.    [provider]    Allergies    Augmentin [amoxicillin-pot clavulanate]  Review of Systems   Review of Systems  Constitutional: Negative for chills and fever.  HENT: Negative for ear pain and sore throat.   Eyes: Negative for pain and visual disturbance.  Respiratory: Negative for cough and shortness of breath.   Cardiovascular: Negative for chest pain and palpitations.  Gastrointestinal: Negative for abdominal pain, nausea and vomiting.  Genitourinary: Negative  for dysuria and hematuria.  Musculoskeletal: Negative for arthralgias and back pain.  Skin: Positive for rash. Negative for color change.  Neurological: Negative for seizures and syncope.  All other systems reviewed and are negative.   Physical Exam Updated Vital Signs BP 121/88 (BP Location: Right Arm)   Pulse 76   Temp 97.9 F (36.6 C) (Oral)   Ht 5\' 5"  (1.651 m)   Wt 73.3 kg   SpO2 98%   BMI 26.89 kg/m   Physical Exam Vitals and nursing note reviewed.  HENT:     Head: Normocephalic and atraumatic.  Eyes:     General: No scleral icterus. Pulmonary:     Effort: Pulmonary effort is normal. No respiratory distress.  Musculoskeletal:        General: Normal range of motion.     Cervical back: Normal range of motion.     Comments: No joint swelling  Skin:    General: Skin is warm and dry.     Capillary Refill: Capillary refill  takes less than 2 seconds.     Comments: On the left upper extremity, there is diffuse erythema at the dorsal aspect of the distal humerus and proximal forearm.  Over the lateral epicondyle, there is an open wound draining purulent discharge.  There is warmth and mild tenderness of the skin without overt fluctuance.  Neurological:     Mental Status: She is alert.  Psychiatric:        Mood and Affect: Mood normal.       ED Results / Procedures / Treatments   Labs (all labs ordered are listed, but only abnormal results are displayed) Labs Reviewed - No data to display  EKG None  Radiology No results found.  Procedures Procedures   Medications Ordered in ED Medications  doxycycline (VIBRA-TABS) tablet 100 mg (has no administration in time range)    ED Course  I have reviewed the triage vital signs and the nursing notes.  Pertinent labs & imaging results that were available during my care of the patient were reviewed by me and considered in my medical decision making (see chart for details).    MDM Rules/Calculators/A&P                          [name] presents with an insect bite that appears to have become infected.  No abscess to drain.  No evidence of a septic joint.  She will be given doxycycline.  Careful return precautions were given. Final Clinical Impression(s) / ED Diagnoses Final diagnoses:  Insect bite of left elbow, initial encounter  Cellulitis of left upper extremity    Rx / DC Orders ED Discharge Orders         Ordered    doxycycline (VIBRAMYCIN) 100 MG capsule  2 times daily        03/24/21 1931           05/24/21, MD 03/24/21 [phone]

## 2021-03-24 NOTE — ED Notes (Signed)
Pt verbalizes understanding of discharge instructions. Opportunity for questioning and answers were provided. Armand removed by staff, pt discharged from ED to home. Instructed to pick up ABX.

## 2021-03-24 NOTE — ED Triage Notes (Signed)
Pt to ED from home with c/o red skin around pt LUE/elbow that started as a small red spot from suspected bug bite that has spread distally and proximally to pt elbow. PT skin is warm and tender to touch.

## 2023-01-14 ENCOUNTER — Other Ambulatory Visit: Payer: Self-pay | Admitting: Obstetrics and Gynecology

## 2023-01-14 DIAGNOSIS — N6310 Unspecified lump in the right breast, unspecified quadrant: Secondary | ICD-10-CM

## 2023-01-23 ENCOUNTER — Other Ambulatory Visit: Payer: BC Managed Care – PPO
